# Patient Record
Sex: Male | Born: 1935 | Race: Black or African American | Hispanic: No | Smoking: Former smoker
Health system: Southern US, Community
[De-identification: ages and names within clinical notes are randomized; demographics above are authoritative.]

## PROBLEM LIST (undated history)

## (undated) DIAGNOSIS — L03319 Cellulitis of trunk, unspecified: Secondary | ICD-10-CM

## (undated) DIAGNOSIS — B000 Eczema herpeticum: Secondary | ICD-10-CM

## (undated) DIAGNOSIS — M7500 Adhesive capsulitis of unspecified shoulder: Secondary | ICD-10-CM

## (undated) DIAGNOSIS — K4090 Unilateral inguinal hernia, without obstruction or gangrene, not specified as recurrent: Secondary | ICD-10-CM

## (undated) DIAGNOSIS — L02219 Cutaneous abscess of trunk, unspecified: Secondary | ICD-10-CM

## (undated) DIAGNOSIS — K219 Gastro-esophageal reflux disease without esophagitis: Secondary | ICD-10-CM

## (undated) DIAGNOSIS — M159 Polyosteoarthritis, unspecified: Secondary | ICD-10-CM

## (undated) DIAGNOSIS — I10 Essential (primary) hypertension: Secondary | ICD-10-CM

## (undated) DIAGNOSIS — R42 Dizziness and giddiness: Secondary | ICD-10-CM

## (undated) DIAGNOSIS — E785 Hyperlipidemia, unspecified: Secondary | ICD-10-CM

## (undated) DIAGNOSIS — M545 Low back pain, unspecified: Secondary | ICD-10-CM

## (undated) DIAGNOSIS — E119 Type 2 diabetes mellitus without complications: Secondary | ICD-10-CM

## (undated) HISTORY — PX: EYE SURGERY: SHX253

## (undated) HISTORY — DX: Adhesive capsulitis of unspecified shoulder: M75.00

## (undated) HISTORY — DX: Cellulitis of trunk, unspecified: L03.319

## (undated) HISTORY — DX: Eczema herpeticum: B00.0

## (undated) HISTORY — DX: Cellulitis of trunk, unspecified: L02.219

## (undated) HISTORY — DX: Polyosteoarthritis, unspecified: M15.9

## (undated) HISTORY — DX: Dizziness and giddiness: R42

## (undated) HISTORY — DX: Low back pain: M54.5

## (undated) HISTORY — DX: Gastro-esophageal reflux disease without esophagitis: K21.9

## (undated) HISTORY — DX: Hyperlipidemia, unspecified: E78.5

## (undated) HISTORY — DX: Low back pain, unspecified: M54.50

---

## 2002-01-07 ENCOUNTER — Ambulatory Visit (HOSPITAL_COMMUNITY): Admission: RE | Admit: 2002-01-07 | Discharge: 2002-01-07 | Payer: Self-pay | Admitting: Internal Medicine

## 2013-11-22 ENCOUNTER — Encounter (HOSPITAL_COMMUNITY): Payer: Self-pay | Admitting: Emergency Medicine

## 2013-11-22 ENCOUNTER — Emergency Department (HOSPITAL_COMMUNITY)
Admission: EM | Admit: 2013-11-22 | Discharge: 2013-11-22 | Disposition: A | Discharge status: Home / Self Care | Payer: Medicare HMO | Attending: Emergency Medicine | Admitting: Emergency Medicine

## 2013-11-22 DIAGNOSIS — Z87891 Personal history of nicotine dependence: Secondary | ICD-10-CM | POA: Insufficient documentation

## 2013-11-22 DIAGNOSIS — I10 Essential (primary) hypertension: Secondary | ICD-10-CM | POA: Diagnosis not present

## 2013-11-22 DIAGNOSIS — E119 Type 2 diabetes mellitus without complications: Secondary | ICD-10-CM | POA: Diagnosis not present

## 2013-11-22 DIAGNOSIS — Z8719 Personal history of other diseases of the digestive system: Secondary | ICD-10-CM | POA: Insufficient documentation

## 2013-11-22 DIAGNOSIS — Z79899 Other long term (current) drug therapy: Secondary | ICD-10-CM | POA: Insufficient documentation

## 2013-11-22 DIAGNOSIS — R109 Unspecified abdominal pain: Secondary | ICD-10-CM | POA: Insufficient documentation

## 2013-11-22 DIAGNOSIS — K4090 Unilateral inguinal hernia, without obstruction or gangrene, not specified as recurrent: Secondary | ICD-10-CM | POA: Insufficient documentation

## 2013-11-22 HISTORY — DX: Essential (primary) hypertension: I10

## 2013-11-22 HISTORY — DX: Gastro-esophageal reflux disease without esophagitis: K21.9

## 2013-11-22 HISTORY — DX: Type 2 diabetes mellitus without complications: E11.9

## 2013-11-22 MED ORDER — HYDROCODONE-ACETAMINOPHEN 5-325 MG PO TABS: 2.0000 | ORAL_TABLET | ORAL | Status: AC | PRN

## 2013-11-22 MED ORDER — NAPROXEN 500 MG PO TABS: 500.0000 mg | ORAL_TABLET | Freq: Two times a day (BID) | ORAL | Status: DC

## 2013-11-22 NOTE — ED Notes (Signed)
Pt c/o swelling and pain to left groin that started x 2 days ago

## 2013-11-22 NOTE — Discharge Instructions (Signed)
You MUST call a surgeon to follow up in the next week - if you develop severe groin pain / vomiting / fevers or abdominal pain, return to the ER immediately.  If the hernia comes out or is hurting, lay flat and place gentle pressure on the area to get the hernia to go back in.  If the pain continues, seek immediate medical attention.

## 2013-11-22 NOTE — ED Provider Notes (Signed)
CSN: [id number]     Arrival date & time 11/22/13  1940 History   First MD Initiated Contact with Patient 11/22/13 1957     Chief Complaint  Patient presents with  . Groin Pain     (Consider location/radiation/quality/duration/timing/severity/associated sxs/prior Treatment) HPI Comments: The patient is a 79 year old male with a history of diabetes and hypertension who presents with a complaint of left groin swelling. He states this started after he used a stool softener because of constipation and then while trying to have a bowel movement was bearing down hard. During this he felt acute onset of pain in his left groin with a mass present. He states that the mass is there when he stands up, when he flexes his leg and when he bears down, it goes away when he lays down. He denies pain but states it is only uncomfortable at times, no nausea vomiting abdominal pain fevers or chills.  Patient is a 79 y.o. male presenting with groin pain. The history is provided by the patient.  Groin Pain    Past Medical History  Diagnosis Date  . Diabetes mellitus without complication   . Hypertension   . Acid reflux    Past Surgical History  Procedure Laterality Date  . Eye surgery Left    History reviewed. No pertinent family history. History  Substance Use Topics  . Smoking status: Former Games developer  . Smokeless tobacco: Not on file  . Alcohol Use: Yes     Comment: everyday    Review of Systems  All other systems reviewed and are negative.     Allergies  Aspirin  Home Medications   Prior to Admission medications   Medication Sig Start Date End Date Taking? Authorizing Provider  HYDROcodone-acetaminophen (NORCO/VICODIN) 5-325 MG per tablet Take 2 tablets by mouth every 4 (four) hours as needed. 11/22/13   Vida Roller, MD  naproxen (NAPROSYN) 500 MG tablet Take 1 tablet (500 mg total) by mouth 2 (two) times daily with a meal. 11/22/13   Vida Roller, MD   BP 125/78  Pulse 79   Temp(Src) 99 F (37.2 C) (Oral)  Resp 20  Ht  (1.88 m)  Wt 225 lb (102.059 kg)  BMI 28.88 kg/m2  SpO2 99% Physical Exam  Nursing note and vitals reviewed. Constitutional: He appears well-developed and well-nourished. No distress.  HENT:  Head: Normocephalic and atraumatic.  Mouth/Throat: Oropharynx is clear and moist. No oropharyngeal exudate.  Eyes: Conjunctivae and EOM are normal. Pupils are equal, round, and reactive to light. Right eye exhibits no discharge. Left eye exhibits no discharge. No scleral icterus.  Neck: Normal range of motion. Neck supple. No JVD present. No thyromegaly present.  Cardiovascular: Normal rate, regular rhythm, normal heart sounds and intact distal pulses.  Exam reveals no gallop and no friction rub.   No murmur heard. Pulmonary/Chest: Effort normal and breath sounds normal. No respiratory distress. He has no wheezes. He has no rales.  Abdominal: Soft. Bowel sounds are normal. He exhibits no distension and no mass. There is no tenderness.  Genitourinary:  Normal-appearing penis and scrotum, right inguinal area normal, left inguinal area with hernia present, this is easily reducible with the patient in Trendelenburg position with gentle pressure.  Musculoskeletal: Normal range of motion. He exhibits no edema and no tenderness.  Lymphadenopathy:    He has no cervical adenopathy.  Neurological: He is alert. Coordination normal.  Skin: Skin is warm and dry. No rash noted. No erythema.  Psychiatric:  He has a normal mood and affect. His behavior is normal.    ED Course  Procedures (including critical care time) Labs Review Labs Reviewed - No data to display  Imaging Review No results found.    MDM   Final diagnoses:  Inguinal hernia, left    The patient has an inguinal hernia, no incarceration, no strangulation, easily reducible and in fact the patient's historical description is that itself produces when the patient is in a supine position.  He will need to general surgery followup, he has been informed of the indications for return including for strangulation or incarceration. He expresses his understanding  General surgery followup given   Meds given in ED:  Medications - No data to display  New Prescriptions   HYDROCODONE-ACETAMINOPHEN (NORCO/VICODIN) 5-325 MG PER TABLET    Take 2 tablets by mouth every 4 (four) hours as needed.   NAPROXEN (NAPROSYN) 500 MG TABLET    Take 1 tablet (500 mg total) by mouth 2 (two) times daily with a meal.        Vida Roller, MD 11/22/13 2018

## 2013-11-23 ENCOUNTER — Emergency Department (HOSPITAL_COMMUNITY): Payer: Medicare HMO

## 2013-11-23 ENCOUNTER — Emergency Department (HOSPITAL_COMMUNITY)
Admission: EM | Admit: 2013-11-23 | Discharge: 2013-11-23 | Disposition: A | Discharge status: Home / Self Care | Payer: Medicare HMO | Attending: Emergency Medicine | Admitting: Emergency Medicine

## 2013-11-23 ENCOUNTER — Encounter (HOSPITAL_COMMUNITY): Payer: Self-pay | Admitting: Emergency Medicine

## 2013-11-23 DIAGNOSIS — R319 Hematuria, unspecified: Secondary | ICD-10-CM | POA: Insufficient documentation

## 2013-11-23 DIAGNOSIS — Z79899 Other long term (current) drug therapy: Secondary | ICD-10-CM | POA: Insufficient documentation

## 2013-11-23 DIAGNOSIS — Z7982 Long term (current) use of aspirin: Secondary | ICD-10-CM | POA: Insufficient documentation

## 2013-11-23 DIAGNOSIS — Z87891 Personal history of nicotine dependence: Secondary | ICD-10-CM | POA: Diagnosis not present

## 2013-11-23 DIAGNOSIS — E119 Type 2 diabetes mellitus without complications: Secondary | ICD-10-CM | POA: Insufficient documentation

## 2013-11-23 DIAGNOSIS — Z791 Long term (current) use of non-steroidal anti-inflammatories (NSAID): Secondary | ICD-10-CM | POA: Diagnosis not present

## 2013-11-23 DIAGNOSIS — K219 Gastro-esophageal reflux disease without esophagitis: Secondary | ICD-10-CM | POA: Diagnosis not present

## 2013-11-23 DIAGNOSIS — K4031 Unilateral inguinal hernia, with obstruction, without gangrene, recurrent: Secondary | ICD-10-CM | POA: Diagnosis not present

## 2013-11-23 DIAGNOSIS — N3090 Cystitis, unspecified without hematuria: Secondary | ICD-10-CM

## 2013-11-23 DIAGNOSIS — K4091 Unilateral inguinal hernia, without obstruction or gangrene, recurrent: Secondary | ICD-10-CM

## 2013-11-23 DIAGNOSIS — I10 Essential (primary) hypertension: Secondary | ICD-10-CM | POA: Insufficient documentation

## 2013-11-23 HISTORY — DX: Unilateral inguinal hernia, without obstruction or gangrene, not specified as recurrent: K40.90

## 2013-11-23 LAB — URINALYSIS, ROUTINE W REFLEX MICROSCOPIC
Bilirubin Urine: NEGATIVE
GLUCOSE, UA: NEGATIVE mg/dL
KETONES UR: NEGATIVE mg/dL
Nitrite: NEGATIVE
Protein, ur: 30 mg/dL — AB
Specific Gravity, Urine: 1.025 (ref 1.005–1.030)
UROBILINOGEN UA: 0.2 mg/dL (ref 0.0–1.0)
pH: 5 (ref 5.0–8.0)

## 2013-11-23 LAB — URINE MICROSCOPIC-ADD ON

## 2013-11-23 LAB — I-STAT CHEM 8, ED
BUN: 11 mg/dL (ref 6–23)
Calcium, Ion: 1.26 mmol/L (ref 1.13–1.30)
Chloride: 98 mEq/L (ref 96–112)
Creatinine, Ser: 1 mg/dL (ref 0.50–1.35)
GLUCOSE: 93 mg/dL (ref 70–99)
HCT: 49 % (ref 39.0–52.0)
Hemoglobin: 16.7 g/dL (ref 13.0–17.0)
Potassium: 3.9 mEq/L (ref 3.7–5.3)
SODIUM: 135 meq/L — AB (ref 137–147)
TCO2: 27 mmol/L (ref 0–100)

## 2013-11-23 MED ORDER — CIPROFLOXACIN HCL 500 MG PO TABS: 500.0000 mg | ORAL_TABLET | Freq: Two times a day (BID) | ORAL | Status: DC

## 2013-11-23 NOTE — ED Notes (Signed)
Patient states he did not receive his prescriptions last night. Discharge instructions from last night with patient. RN found RX attached to d/c paperwork and pointed them out to patient.

## 2013-11-23 NOTE — ED Notes (Signed)
Patient seen last night for L groin pain which was a hernia.  Overnight, he began to notice blood in his urine and is returning today for this.

## 2013-11-23 NOTE — Discharge Instructions (Signed)
If you were given medicines take as directed.  If you are on coumadin or contraceptives realize their levels and effectiveness is altered by many different medicines.  If you have any reaction (rash, tongues swelling, other) to the medicines stop taking and see a physician.   Please follow up as directed and return to the ER or see a physician for new or worsening symptoms.  Thank you. Filed Vitals:   11/23/13 1059 11/23/13 1321  BP: 126/77 105/66  Pulse: 80 79  Temp: 98.1 F (36.7 C)   Resp: 18 17  Height:  (1.88 m)   Weight: 225 lb (102.059 kg)   SpO2: 96% 97%    Inguinal Hernia, Adult Muscles help keep everything in the body in its proper place. But if a weak spot in the muscles develops, something can poke through. That is called a hernia. When this happens in the lower part of the belly (abdomen), it is called an inguinal hernia. (It takes its name from a part of the body in this region called the inguinal canal.) A weak spot in the wall of muscles lets some fat or part of the small intestine bulge through. An inguinal hernia can develop at any age. Men get them more often than women. CAUSES  In adults, an inguinal hernia develops over time.  It can be triggered by:  Suddenly straining the muscles of the lower abdomen.  Lifting heavy objects.  Straining to have a bowel movement. Difficult bowel movements (constipation) can lead to this.  Constant coughing. This may be caused by smoking or lung disease.  Being overweight.  Being pregnant.  Working at a job that requires long periods of standing or heavy lifting.  Having had an inguinal hernia before. One type can be an emergency situation. It is called a strangulated inguinal hernia. It develops if part of the small intestine slips through the weak spot and cannot get back into the abdomen. The blood supply can be cut off. If that happens, part of the intestine may die. This situation requires emergency surgery. SYMPTOMS   Often, a small inguinal hernia has no symptoms. It is found when a healthcare provider does a physical exam. Larger hernias usually have symptoms.   In adults, symptoms may include:  A lump in the groin. This is easier to see when the person is standing. It might disappear when lying down.  In men, a lump in the scrotum.  Pain or burning in the groin. This occurs especially when lifting, straining or coughing.  A dull ache or feeling of pressure in the groin.  Signs of a strangulated hernia can include:  A bulge in the groin that becomes very painful and tender to the touch.  A bulge that turns red or purple.  Fever, nausea and vomiting.  Inability to have a bowel movement or to pass gas. DIAGNOSIS  To decide if you have an inguinal hernia, a healthcare provider will probably do a physical examination.  This will include asking questions about any symptoms you have noticed.  The healthcare provider might feel the groin area and ask you to cough. If an inguinal hernia is felt, the healthcare provider may try to slide it back into the abdomen.  Usually no other tests are needed. TREATMENT  Treatments can vary. The size of the hernia makes a difference. Options include:  Watchful waiting. This is often suggested if the hernia is small and you have had no symptoms.  No medical procedure  will be done unless symptoms develop.  You will need to watch closely for symptoms. If any occur, contact your healthcare provider right away.  Surgery. This is used if the hernia is larger or you have symptoms.  Open surgery. This is usually an outpatient procedure (you will not stay overnight in a hospital). An cut (incision) is made through the skin in the groin. The hernia is put back inside the abdomen. The weak area in the muscles is then repaired by herniorrhaphy or hernioplasty. Herniorrhaphy: in this type of surgery, the weak muscles are sewn back together. Hernioplasty: a patch or mesh  is used to close the weak area in the abdominal wall.  Laparoscopy. In this procedure, a surgeon makes small incisions. A thin tube with a tiny video camera (called a laparoscope) is put into the abdomen. The surgeon repairs the hernia with mesh by looking with the video camera and using two long instruments. HOME CARE INSTRUCTIONS   After surgery to repair an inguinal hernia:  You will need to take pain medicine prescribed by your healthcare provider. Follow all directions carefully.  You will need to take care of the wound from the incision.  Your activity will be restricted for awhile. This will probably include no heavy lifting for several weeks. You also should not do anything too active for a few weeks. When you can return to work will depend on the type of job that you have.  During "watchful waiting" periods, you should:  Maintain a healthy weight.  Eat a diet high in fiber (fruits, vegetables and whole grains).  Drink plenty of fluids to avoid constipation. This means drinking enough water and other liquids to keep your urine clear or pale yellow.  Do not lift heavy objects.  Do not stand for long periods of time.  Quit smoking. This should keep you from developing a frequent cough. SEEK MEDICAL CARE IF:   A bulge develops in your groin area.  You feel pain, a burning sensation or pressure in the groin. This might be worse if you are lifting or straining.  You develop a fever of more than 100.5 F (38.1 C). SEEK IMMEDIATE MEDICAL CARE IF:   Pain in the groin increases suddenly.  A bulge in the groin gets bigger suddenly and does not go down.  For men, there is sudden pain in the scrotum. Or, the size of the scrotum increases.  A bulge in the groin area becomes red or purple and is painful to touch.  You have nausea or vomiting that does not go away.  You feel your heart beating much faster than normal.  You cannot have a bowel movement or pass gas.  You  develop a fever of more than 102.0 F (38.9 C). Document Released: 07/09/2008 Document Revised: 05/15/2011 Document Reviewed: 07/09/2008 [facility] Patient Information 2015 [place], [place]. This information is not intended to replace advice given to you by your health care provider. Make sure you discuss any questions you have with your health care provider.

## 2013-11-23 NOTE — ED Provider Notes (Signed)
CSN: [id number]     Arrival date & time 11/23/13  1017 History   This chart was scribed for Enid Skeens, MD by Gwenyth Ober, ED Scribe. This patient was seen in room APA11/APA11 and the patient's care was started at 11:48 AM.     Chief Complaint  Patient presents with  . Hematuria   The history is provided by the patient. No language interpreter was used.   HPI Comments: Victor Church is a 79 y.o. male who presents to the Emergency Department complaining of hematuria that started last night. Pt was in ED last night for hernia. He has no prior history of similar symptoms and hx of kidney stones, kidney disease, and prostate cancer. Pt states he did notice feeling something hard that came through penis during urination 3 days ago. He currently takes laxatives for abnormal BM. Pt denies vomitting, fevers, chills, and pain as associated symptoms.  Past Medical History  Diagnosis Date  . Diabetes mellitus without complication   . Hypertension   . Acid reflux   . Inguinal hernia    Past Surgical History  Procedure Laterality Date  . Eye surgery Left    History reviewed. No pertinent family history. History  Substance Use Topics  . Smoking status: Former Games developer  . Smokeless tobacco: Not on file  . Alcohol Use: Yes     Comment: everyday    Review of Systems  Constitutional: Negative for fever and chills.  Gastrointestinal: Negative for vomiting and abdominal pain.  Genitourinary: Positive for hematuria.  All other systems reviewed and are negative.     Allergies  Aspirin  Home Medications   Prior to Admission medications   Medication Sig Start Date End Date Taking? Authorizing Provider  aspirin EC 81 MG tablet Take 81 mg by mouth daily.    Historical Provider, MD  Glucosamine-Chondroit-Vit C-Mn (GLUCOSAMINE 1500 COMPLEX PO) Take 1 tablet by mouth daily.    Historical Provider, MD  HYDROcodone-acetaminophen (NORCO/VICODIN) 5-325 MG per tablet Take 2 tablets by mouth  every 4 (four) hours as needed. 11/22/13   Vida Roller, MD  lisinopril (PRINIVIL,ZESTRIL) 5 MG tablet Take 5 mg by mouth daily. 09/29/13   Historical Provider, MD  metFORMIN (GLUCOPHAGE) 1000 MG tablet Take 1,000 mg by mouth 2 (two) times daily. 09/29/13   Historical Provider, MD  Multiple Vitamin (MULTIVITAMIN WITH MINERALS) TABS tablet Take 1 tablet by mouth daily.    Historical Provider, MD  naproxen (NAPROSYN) 500 MG tablet Take 1 tablet (500 mg total) by mouth 2 (two) times daily with a meal. 11/22/13   Vida Roller, MD  Omega-3 Fatty Acids (FISH OIL PO) Take 1 capsule by mouth daily.    Historical Provider, MD  pravastatin (PRAVACHOL) 40 MG tablet Take 40 mg by mouth at bedtime. 09/29/13   Historical Provider, MD  ranitidine (ZANTAC) 150 MG tablet Take 150 mg by mouth 2 (two) times daily.    Historical Provider, MD   BP 126/77  Pulse 80  Temp(Src) 98.1 F (36.7 C)  Resp 18  Ht  (1.88 m)  Wt 225 lb (102.059 kg)  BMI 28.88 kg/m2  SpO2 96% Physical Exam  Constitutional: He appears well-developed and well-nourished. No distress.  HENT:  Head: Normocephalic and atraumatic.  Eyes: Conjunctivae and EOM are normal.  Neck: Neck supple. No tracheal deviation present.  Cardiovascular: Normal rate.   Pulmonary/Chest: Effort normal. No respiratory distress.  Abdominal: Soft. There is no tenderness.  Neurological: He is alert.  Skin: Skin is warm and dry.  Psychiatric: He has a normal mood and affect. His behavior is normal.    ED Course  Procedures (including critical care time)  DIAGNOSTIC STUDIES: Oxygen Saturation is 96% on RA, adequate by my interpretation.    COORDINATION OF CARE: 11:51 AM Discussed UA results with pt that revealed large amount of blood, but no significant infection in urine. Will order CT scan of abdomen and lab work. Pt agreed to plan.    Labs Review Labs Reviewed  URINALYSIS, ROUTINE W REFLEX MICROSCOPIC - Abnormal; Notable for the following:     Color, Urine BROWN (*)    APPearance CLOUDY (*)    Hgb urine dipstick LARGE (*)    Protein, ur 30 (*)    Leukocytes, UA SMALL (*)    All other components within normal limits  I-STAT CHEM 8, ED - Abnormal; Notable for the following:    Sodium 135 (*)    All other components within normal limits  URINE MICROSCOPIC-ADD ON    Imaging Review Ct Renal Stone Study  11/23/2013   CLINICAL DATA:  Left groin pain, hematuria.  EXAM: CT ABDOMEN AND PELVIS WITHOUT CONTRAST  TECHNIQUE: Multidetector CT imaging of the abdomen and pelvis was performed following the standard protocol without IV contrast.  COMPARISON:  None.  FINDINGS: Visualization of the lower thorax demonstrates no consolidative or nodular pulmonary opacities. Normal heart size.  Lack of intravenous contrast material limits evaluation the solid organ parenchyma.  Sub cm low-attenuation lesion within the liver (image 15; series 2) is too small accurately characterize. Gallbladder is unremarkable. Spleen, pancreas and bilateral adrenal glands are unremarkable. Kidneys are symmetric in size. There is a 4 mm nonobstructing stone within the interpolar region of the left kidney. Bilateral nonspecific perinephric fat stranding.  Normal caliber abdominal aorta. No retroperitoneal lymphadenopathy. Stool is present throughout the colon. The appendix is normal. Mild wall thickening of the rectum likely secondary to associated adjacent inflammatory change.  The urinary bladder is decompressed however appears thick walled and demonstrates extensive surrounding fat stranding. The superior aspect of the urinary bladder is herniated into a fat containing and bladder containing left inguinal hernia. There is a small amount of fluid within the left inguinal hernia. There is fat stranding about the prostate. Punctate radiodensities adjacent to the left aspect of the urinary bladder favored to represent phleboliths. Small amount of free fluid in the pelvis.  Lower  lumbar spine degenerative change. No aggressive or acute appearing osseous lesions.  IMPRESSION: Urinary bladder is decompressed with wall thickening and extensive surrounding fat stranding. The cranial aspect of the urinary bladder is herniated into a left inguinal hernia. Overall findings are concerning for cystitis with superimposed herniation of the superior portion of the bladder into the left inguinal hernia.  Additionally there is fat stranding about prostate which may be secondary to the process involving urinary bladder however superimposed prostatitis is a consideration.   Electronically Signed   By: Annia Belt M.D.   On: 11/23/2013 12:32     EKG Interpretation None      MDM   Final diagnoses:  Hematuria  Unilateral recurrent inguinal hernia without obstruction or gangrene  Cystitis   I personally performed the services described in this documentation, which was scribed in my presence. The recorded information has been reviewed and is accurate.  Patient with isolated hematuria, no groin or abdominal pain. Discussed differential with the patient. CT scan done no kidney stones seen, mild herniation of  bladder intake while region, no signs of incarceration, patient has no pain in ER and well-appearing. Differential including cystitis/prostatitis however patient has no pain likely cystitis. Followup with urology discussed.  PO abx for home.  Results and differential diagnosis were discussed with the patient/parent/guardian. Close follow up outpatient was discussed, comfortable with the plan.   Medications - No data to display  Filed Vitals:   11/23/13 1059 11/23/13 1321  BP: 126/77 105/66  Pulse: 80 79  Temp: 98.1 F (36.7 C)   Resp: 18 17  Height:  (1.88 m)   Weight: 225 lb (102.059 kg)   SpO2: 96% 97%    Final diagnoses:  Hematuria  Unilateral recurrent inguinal hernia without obstruction or gangrene  Cystitis      Enid Skeens, MD 11/23/13 1336

## 2013-11-23 NOTE — ED Notes (Signed)
Patient with no complaints at this time. Respirations even and unlabored. Skin warm/dry. Discharge instructions reviewed with patient at this time. Patient given opportunity to voice concerns/ask questions. Patient discharged at this time and left Emergency Department with steady gait.   

## 2014-03-17 HISTORY — PX: HERNIA REPAIR: SHX51

## 2014-05-18 ENCOUNTER — Encounter: Payer: Self-pay | Admitting: Internal Medicine

## 2014-05-18 ENCOUNTER — Ambulatory Visit (INDEPENDENT_AMBULATORY_CARE_PROVIDER_SITE_OTHER): Payer: Medicare PPO | Admitting: Internal Medicine

## 2014-05-18 VITALS — BP 140/72 | HR 76 | Ht 75.0 in | Wt 232.1 lb

## 2014-05-18 DIAGNOSIS — R002 Palpitations: Secondary | ICD-10-CM

## 2014-05-18 NOTE — Patient Instructions (Signed)
Your physician recommends that you continue on your current medications as directed. Please refer to the Current Medication list given to you today. Your physician recommends that you schedule a follow-up appointment as needed with Dr. Ross.   

## 2014-05-18 NOTE — Progress Notes (Signed)
Cardiology Office Note   Date:  05/18/2014   ID:  Victor Church, DOB 16-Dec-1934, MRN [id number]  PCP:  PROVIDER NOT IN SYSTEM  Cardiologist:   Dietrich Pates, MD   Patinet referred for evluation of palpitations.     History of Present Illness: Victor Church is a 80 y.o. male who is seen by Ferdie Ping.  Seen on 3/7  Complained of intermitt palpitations associated with dizziness  that occur at least 5x per day for about 1 week.   One episode occurred while driving  He does note this [place] and he gets very anxious on travel on roads unfamiliar to him   Denies syncope Since he was seen on the 7th it has improved some  Much less frequent    Just prior to it starting he had eaten a lot of fried food  Constipated though  This has improved     During day drinks amply  Light urine    Denies CP  No SOB     Current Outpatient Prescriptions  Medication Sig Dispense Refill  . aspirin EC 81 MG tablet Take 81 mg by mouth daily.    . colchicine 0.6 MG tablet Take 0.6 mg by mouth as needed. Two tables than 1 tablet one hour later    . docusate sodium (COLACE) 100 MG capsule Take 100 mg by mouth daily.    . Glucosamine-Chondroit-Vit C-Mn (GLUCOSAMINE 1500 COMPLEX PO) Take 1 tablet by mouth daily.    Marland Kitchen HYDROcodone-acetaminophen (NORCO/VICODIN) 5-325 MG per tablet Take 2 tablets by mouth every 4 (four) hours as needed. 10 tablet 0  . lisinopril (PRINIVIL,ZESTRIL) 5 MG tablet Take 5 mg by mouth daily.    . metFORMIN (GLUCOPHAGE) 1000 MG tablet Take 1,000 mg by mouth 2 (two) times daily.    . Multiple Vitamin (MULTIVITAMIN WITH MINERALS) TABS tablet Take 1 tablet by mouth daily.    . Omega-3 Fatty Acids (FISH OIL PO) Take 1 capsule by mouth daily.    . pravastatin (PRAVACHOL) 40 MG tablet Take 40 mg by mouth at bedtime.    . psyllium (METAMUCIL) 58.6 % powder Take 1 packet by mouth daily.    . ranitidine (ZANTAC) 150 MG tablet Take 150 mg by mouth as needed.      No current  facility-administered medications for this visit.    Allergies:   Aspirin   Past Medical History  Diagnosis Date  . Diabetes mellitus without complication   . Hypertension   . Acid reflux   . Inguinal hernia   . Eczema herpeticum   . Hyperlipidemia   . GERD without esophagitis   . Cellulitis and abscess of trunk   . Generalized osteoarthritis of unspecified site   . Low back pain   . Adhesive capsulitis of shoulder   . Dizziness and giddiness     Past Surgical History  Procedure Laterality Date  . Eye surgery Left   . Hernia repair  03/17/14     Social History:  The patient  reports that he has quit smoking. He does not have any smokeless tobacco history on file. He reports that he drinks alcohol. He reports that he does not use illicit drugs.   Family History:  The patient's family history includes Cancer in his mother; Diabetes in his maternal grandmother.    ROS:  Please see the history of present illness. All other systems are reviewed and  Negative to the above problem except as noted.  PHYSICAL EXAM: VS:  BP 140/72 mmHg  Pulse 76  Ht 6\' 3"  (1.905 m)  Wt 232 lb 1.9 oz (105.289 kg)  BMI 29.01 kg/m2  GEN: Well nourished, well developed, in no acute distress HEENT: normal Neck: no JVD, carotid bruits, or masses Cardiac: RRR; no murmurs, rubs, or gallops,no edema  Respiratory:  clear to auscultation bilaterally, normal work of breathing GI: soft, nontender, nondistended, + BS  No hepatomegaly  MS: no deformity Moving all extremities   Skin: warm and dry, no rash Neuro:  Strength and sensation are intact Psych: euthymic mood, full affect   EKG:  EKG is ordered today.  NSR 76 bpm  Nonspecific ST T wave cahnges     Lipid Panel No results found for: CHOL, TRIG, HDL, CHOLHDL, VLDL, LDLCALC, LDLDIRECT    Wt Readings from Last 3 Encounters:  05/18/14 232 lb 1.9 oz (105.289 kg)  11/23/13 225 lb (102.059 kg)  11/22/13 225 lb (102.059 kg)      ASSESSMENT  AND PLAN: 1  Palpitations  Improving  Short lived when they did occur (seconds) but did have some dizziness  No syncope I would follow  If do not improve I would set up for event monitor.  I would not now since getting better  2.  HTN  Good control  3.  HL  [facility]  Continue statin    Will be available as needed  I would not sched testing now.      Current medicines are reviewed at length with the patient today.  The patient does not have concerns regarding medicines.  The following changes have been made: None    Labs/ tests ordered today include:  NOone No orders of the defined types were placed in this encounter.      Signed, Dietrich PatesPaula Urie Loughner, MD  05/18/2014 10:13 AM    [facility] [address], [place], [place]  [address] Phone: [phone]; Fax: [phone]

## 2015-01-04 ENCOUNTER — Emergency Department (HOSPITAL_COMMUNITY)
Admission: EM | Admit: 2015-01-04 | Discharge: 2015-01-04 | Discharge status: Against Medical Advice | Payer: Medicare PPO | Attending: Emergency Medicine | Admitting: Emergency Medicine

## 2015-01-04 ENCOUNTER — Encounter (HOSPITAL_COMMUNITY): Payer: Self-pay

## 2015-01-04 DIAGNOSIS — I10 Essential (primary) hypertension: Secondary | ICD-10-CM | POA: Insufficient documentation

## 2015-01-04 DIAGNOSIS — L0292 Furuncle, unspecified: Secondary | ICD-10-CM | POA: Insufficient documentation

## 2015-01-04 DIAGNOSIS — E119 Type 2 diabetes mellitus without complications: Secondary | ICD-10-CM | POA: Insufficient documentation

## 2015-01-04 NOTE — ED Notes (Signed)
Pt states he has a boil that he wants lanced. States he is already on an antibiotic for same

## 2015-02-24 IMAGING — CT CT RENAL STONE PROTOCOL
2 of 4 series · 16 of 46 positions shown, 18 images · IV contrast (Omnipaque 300)
Comparison: None.

CLINICAL DATA: Left groin pain, hematuria.

EXAM:
CT ABDOMEN AND PELVIS WITHOUT CONTRAST
TECHNIQUE: Multidetector CT imaging of the abdomen and pelvis was performed
following the standard protocol without IV contrast.

[Series 2: abd_pel w/o 5.0 b40f · axial · non-contrast · 0.84mm/px · z∈[-538,-88]mm · 13 of 100 slices shown, 15 images]
[im 5/100  soft-tissue]
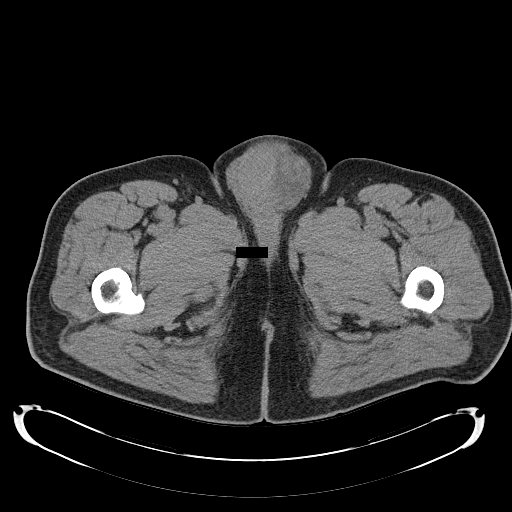
[im 5/100  bone]
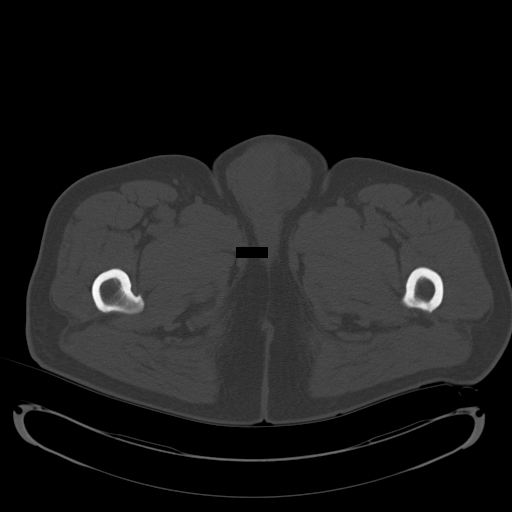
[im 13/100  soft-tissue]
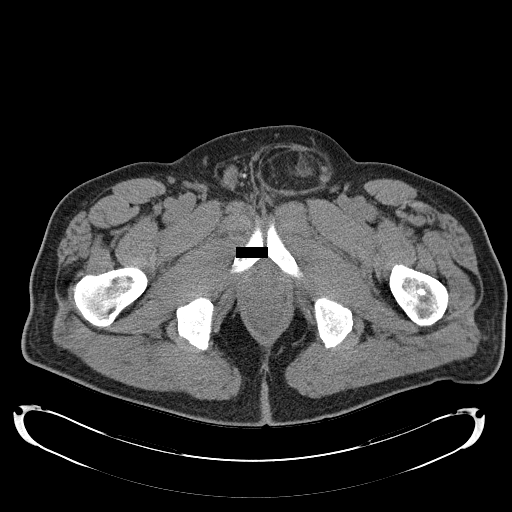
[im 21/100  soft-tissue]
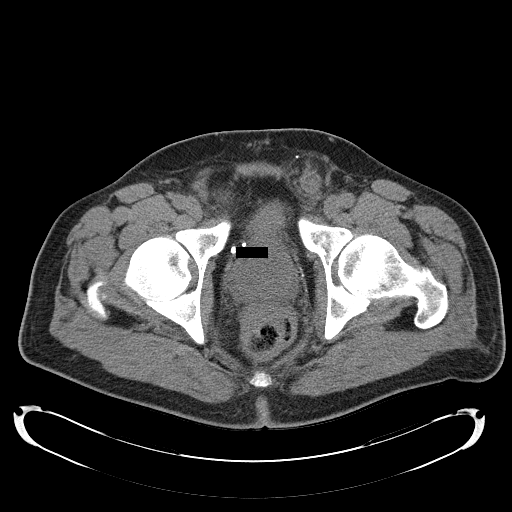
[im 29/100  soft-tissue]
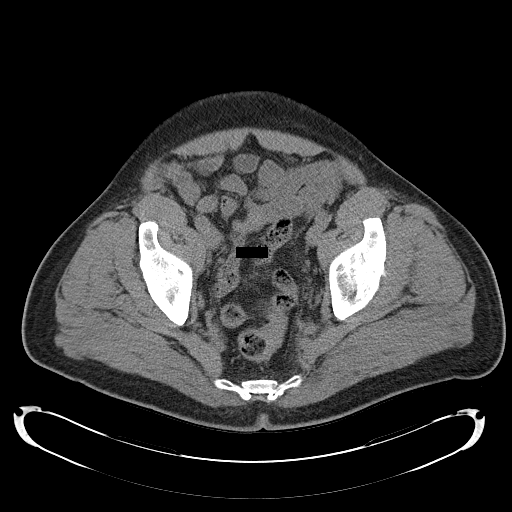
[im 34/100  soft-tissue]
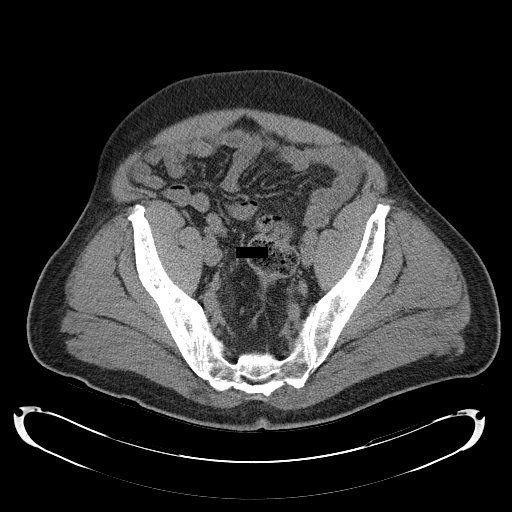
[im 42/100  soft-tissue]
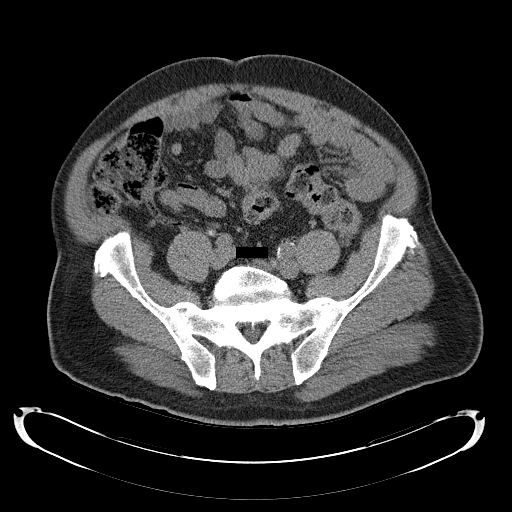
[im 50/100  soft-tissue]
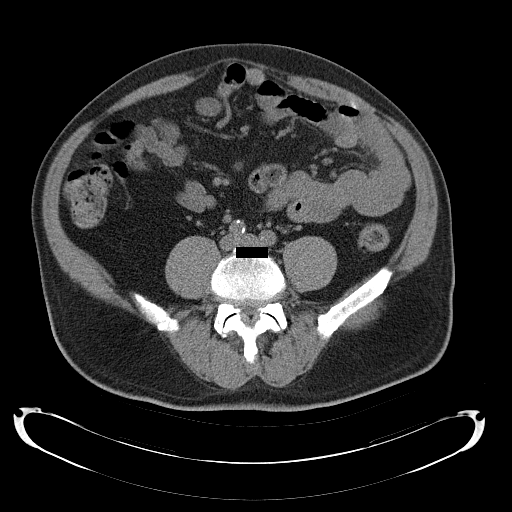
[im 58/100  soft-tissue]
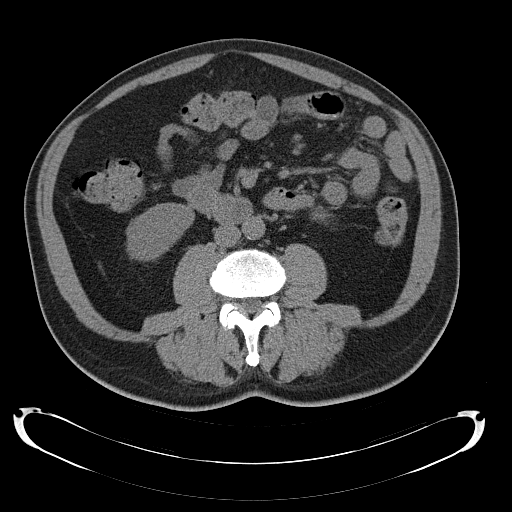
[im 67/100  soft-tissue]
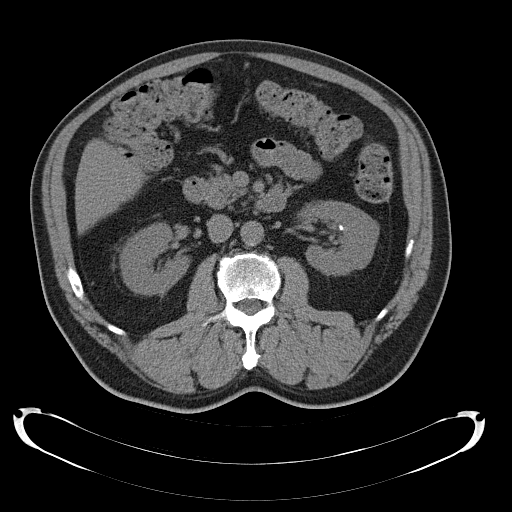
[im 67/100  bone]
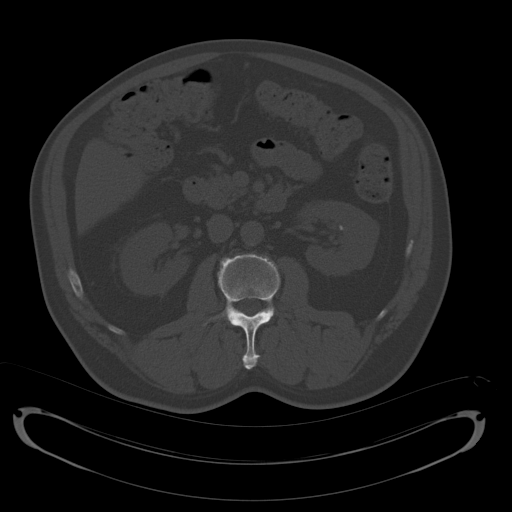
[im 71/100  soft-tissue]
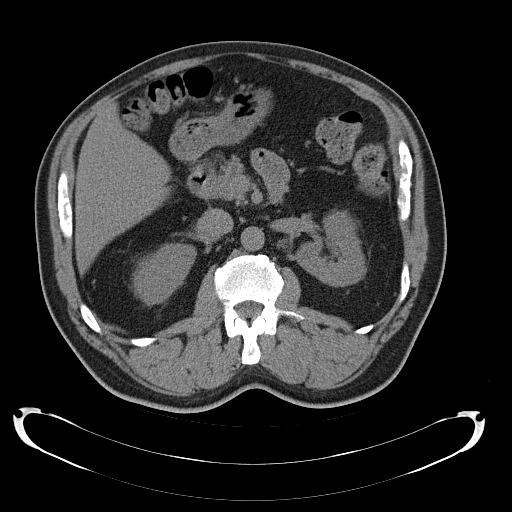
[im 79/100  soft-tissue]
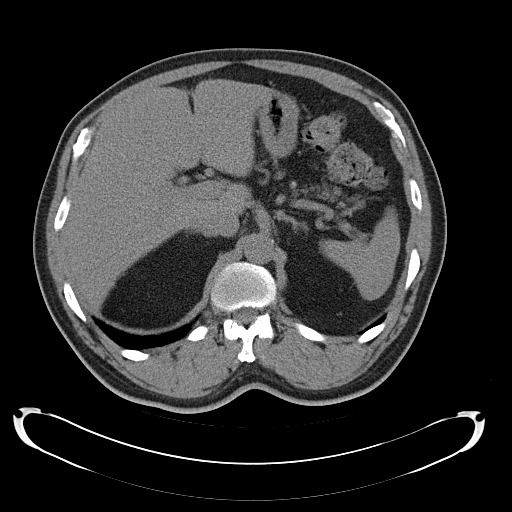
[im 87/100  soft-tissue]
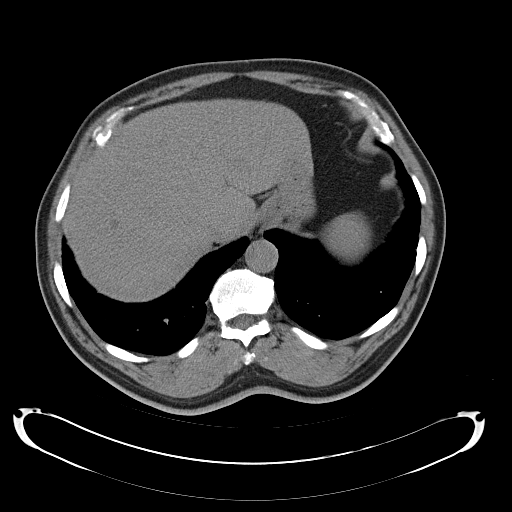
[im 95/100  soft-tissue]
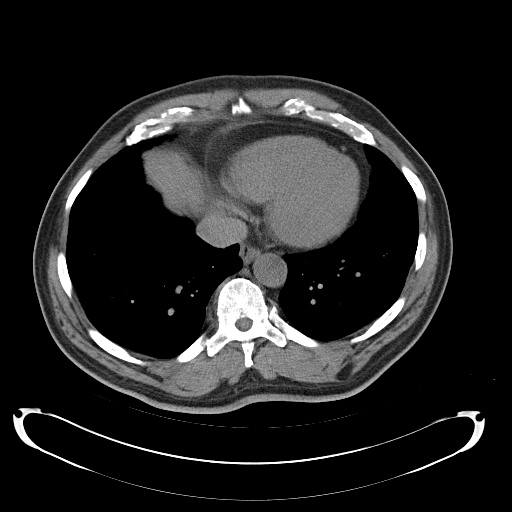

[Series 3: abd_pel w/o 3.0 spo cor cor · coronal · non-contrast · 0.82mm/px · 3 of 98 slices shown]
[im 33/98  soft-tissue]
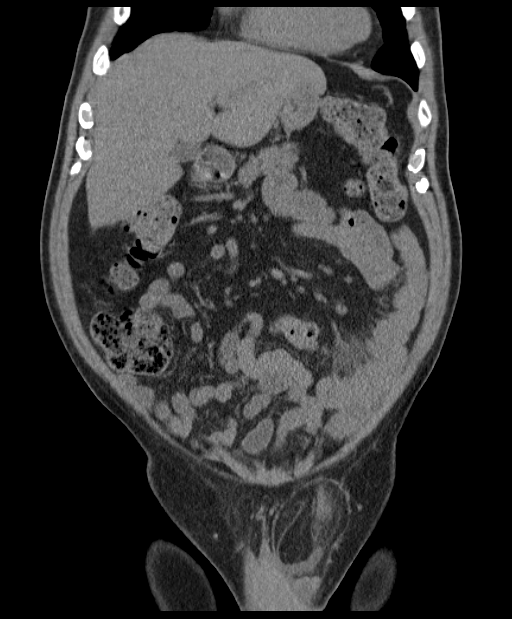
[im 44/98  soft-tissue]
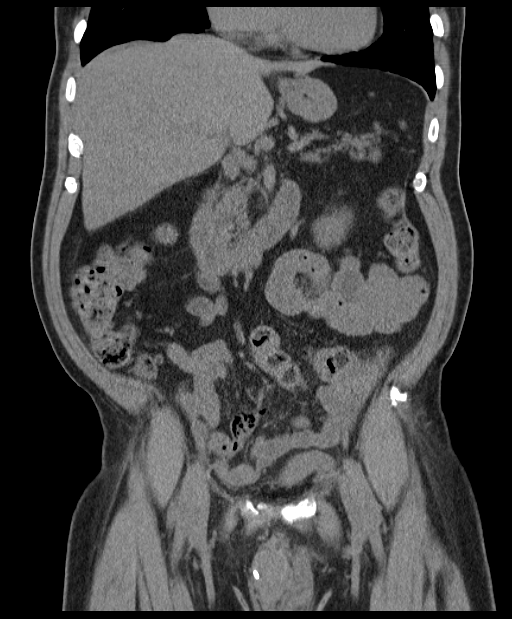
[im 54/98  soft-tissue]
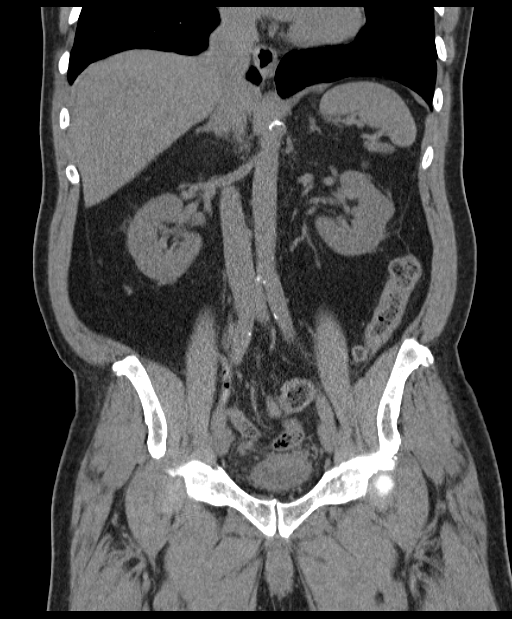

[16 of 46 positions shown; findings below may reference images not displayed]

FINDINGS: Visualization of the lower thorax demonstrates no consolidative or
nodular pulmonary opacities. Normal heart size.

Lack of intravenous contrast material limits evaluation the solid
organ parenchyma.

Sub cm low-attenuation lesion within the liver (image 15; series 2)
is too small accurately characterize. Gallbladder is unremarkable.
Spleen, pancreas and bilateral adrenal glands are unremarkable.
Kidneys are symmetric in size. There is a 4 mm nonobstructing stone
within the interpolar region of the left kidney. Bilateral
nonspecific perinephric fat stranding.

Normal caliber abdominal aorta. No retroperitoneal lymphadenopathy.
Stool is present throughout the colon. The appendix is normal. Mild
wall thickening of the rectum likely secondary to associated
adjacent inflammatory change.

The urinary bladder is decompressed however appears thick walled and
demonstrates extensive surrounding fat stranding. The superior
aspect of the urinary bladder is herniated into a fat containing and
bladder containing left inguinal hernia. There is a small amount of
fluid within the left inguinal hernia. There is fat stranding about
the prostate. Punctate radiodensities adjacent to the left aspect of
the urinary bladder favored to represent phleboliths. Small amount
of free fluid in the pelvis.

Lower lumbar spine degenerative change. No aggressive or acute
appearing osseous lesions.
IMPRESSION: Urinary bladder is decompressed with wall thickening and extensive
surrounding fat stranding. The cranial aspect of the urinary bladder
is herniated into a left inguinal hernia. Overall findings are
concerning for cystitis with superimposed herniation of the superior
portion of the bladder into the left inguinal hernia.

Additionally there is fat stranding about prostate which may be
secondary to the process involving urinary bladder however
superimposed prostatitis is a consideration.

## 2022-01-24 ENCOUNTER — Emergency Department (HOSPITAL_COMMUNITY)
Admission: EM | Admit: 2022-01-24 | Discharge: 2022-01-24 | Disposition: A | Discharge status: Home / Self Care | Payer: Medicare PPO | Attending: Emergency Medicine | Admitting: Emergency Medicine

## 2022-01-24 ENCOUNTER — Emergency Department (HOSPITAL_COMMUNITY): Payer: Medicare PPO

## 2022-01-24 ENCOUNTER — Other Ambulatory Visit: Payer: Self-pay

## 2022-01-24 ENCOUNTER — Encounter (HOSPITAL_COMMUNITY): Payer: Self-pay

## 2022-01-24 DIAGNOSIS — Z7982 Long term (current) use of aspirin: Secondary | ICD-10-CM | POA: Insufficient documentation

## 2022-01-24 DIAGNOSIS — Z7984 Long term (current) use of oral hypoglycemic drugs: Secondary | ICD-10-CM | POA: Diagnosis not present

## 2022-01-24 DIAGNOSIS — I10 Essential (primary) hypertension: Secondary | ICD-10-CM | POA: Insufficient documentation

## 2022-01-24 DIAGNOSIS — K6289 Other specified diseases of anus and rectum: Secondary | ICD-10-CM | POA: Diagnosis present

## 2022-01-24 DIAGNOSIS — K5900 Constipation, unspecified: Secondary | ICD-10-CM | POA: Diagnosis not present

## 2022-01-24 DIAGNOSIS — E119 Type 2 diabetes mellitus without complications: Secondary | ICD-10-CM | POA: Diagnosis not present

## 2022-01-24 DIAGNOSIS — Z79899 Other long term (current) drug therapy: Secondary | ICD-10-CM | POA: Insufficient documentation

## 2022-01-24 LAB — COMPREHENSIVE METABOLIC PANEL
ALT: 8 U/L (ref 0–44)
AST: 16 U/L (ref 15–41)
Albumin: 3.7 g/dL (ref 3.5–5.0)
Alkaline Phosphatase: 42 U/L (ref 38–126)
Anion gap: 6 (ref 5–15)
BUN: 12 mg/dL (ref 8–23)
CO2: 26 mmol/L (ref 22–32)
Calcium: 9.1 mg/dL (ref 8.9–10.3)
Chloride: 103 mmol/L (ref 98–111)
Creatinine, Ser: 1.13 mg/dL (ref 0.61–1.24)
GFR, Estimated: >60 mL/min (ref >60)
Glucose, Bld: 141 mg/dL — ABNORMAL HIGH (ref 70–99)
Potassium: 3.6 mmol/L (ref 3.5–5.1)
Sodium: 135 mmol/L (ref 135–145)
Total Bilirubin: 1.3 mg/dL — ABNORMAL HIGH (ref 0.3–1.2)
Total Protein: 7 g/dL (ref 6.5–8.1)

## 2022-01-24 LAB — CBC WITH DIFFERENTIAL/PLATELET
Abs Immature Granulocytes: 0.04 10*3/uL (ref 0.00–0.07)
Basophils Absolute: 0 10*3/uL (ref 0.0–0.1)
Basophils Relative: 0 %
Eosinophils Absolute: 0 10*3/uL (ref 0.0–0.5)
Eosinophils Relative: 0 %
HCT: 37.6 % — ABNORMAL LOW (ref 39.0–52.0)
Hemoglobin: 12.7 g/dL — ABNORMAL LOW (ref 13.0–17.0)
Immature Granulocytes: 0 %
Lymphocytes Relative: 6 %
Lymphs Abs: 0.7 10*3/uL (ref 0.7–4.0)
MCH: 31.4 pg (ref 26.0–34.0)
MCHC: 33.8 g/dL (ref 30.0–36.0)
MCV: 92.8 fL (ref 80.0–100.0)
Monocytes Absolute: 1.1 10*3/uL — ABNORMAL HIGH (ref 0.1–1.0)
Monocytes Relative: 10 %
Neutro Abs: 9.2 10*3/uL — ABNORMAL HIGH (ref 1.7–7.7)
Neutrophils Relative %: 84 %
Platelets: 202 10*3/uL (ref 150–400)
RBC: 4.05 MIL/uL — ABNORMAL LOW (ref 4.22–5.81)
RDW: 14.1 % (ref 11.5–15.5)
WBC: 11.1 10*3/uL — ABNORMAL HIGH (ref 4.0–10.5)
nRBC: 0 % (ref 0.0–0.2)

## 2022-01-24 MED ORDER — MAGNESIUM HYDROXIDE 400 MG/5ML PO SUSP
960.0000 mL | Freq: Once | ORAL | Status: AC
  Administered 2022-01-24: 960 mL via RECTAL
  Filled 2022-01-24: qty 240

## 2022-01-24 MED ORDER — LIDOCAINE (ANORECTAL) 5 % EX CREA: TOPICAL_CREAM | CUTANEOUS | 0 refills | Status: AC

## 2022-01-24 NOTE — ED Triage Notes (Signed)
Pt c/o painful hemorroids since Sunday, no bleeding noted . Pt reported some loose stools also.

## 2022-01-24 NOTE — Discharge Instructions (Signed)
Your work-up today showed significant constipation.  You have been given an enema to help with this.  As discussed, it is very important that you drink plenty of water every day.  Try to eat lots of leafy green vegetables and foods high in fiber.  You may continue to use the Anusol cream you were prescribed yesterday in addition to the lidocaine prescribed today.  Try using sitz bath's several times a day to help with the discomfort as well.  Please follow-up with your primary care provider for recheck.  Return emergency department for any new or worsening symptoms.

## 2022-01-24 NOTE — ED Notes (Signed)
Patient on BSC. Had large stool BM x 4 as result of enema.  Patient states abdomen is relieved and he feels much better.

## 2022-01-28 NOTE — ED Provider Notes (Signed)
[facility] EMERGENCY DEPARTMENT Provider Note   CSN: [id number] Arrival date & time: 01/24/22  1019     History  Chief Complaint  Patient presents with   Hemorrhoids    Victor Church is a 87 y.o. male.  HPI     Victor Church is a 87 y.o. male with hx of DM, HTN, recurrent constipation who presents to the Emergency Department complaining of rectal pain and hemorrhoids.  Constipated for several days.  Admits to straining to defecate.  Used enema and laxative w/o relief.  HAd several bouts of watery stools since then.  Denies any abdominal pain, vomiting or fever.  Seen by PCP and prescribed Anusol cream which he has used several times w/o relief.    Home Medications Prior to Admission medications   Medication Sig Start Date End Date Taking? Authorizing Provider  Lidocaine, Anorectal, 5 % CREA Apply small amt to the rectum 4 times per day as needed.  Do not insert inside the rectum 01/24/22  Yes Franny Selvage, PA-C  aspirin EC 81 MG tablet Take 81 mg by mouth daily.    [provider]  colchicine 0.6 MG tablet Take 0.6 mg by mouth as needed. Two tables than 1 tablet one hour later    [provider]  docusate sodium (COLACE) 100 MG capsule Take 100 mg by mouth daily.    [provider]  Glucosamine-Chondroit-Vit C-Mn (GLUCOSAMINE 1500 COMPLEX PO) Take 1 tablet by mouth daily.    [provider]  HYDROcodone-acetaminophen (NORCO/VICODIN) 5-325 MG per tablet Take 2 tablets by mouth every 4 (four) hours as needed. 11/22/13   Eber Hong, MD  lisinopril (PRINIVIL,ZESTRIL) 5 MG tablet Take 5 mg by mouth daily. 09/29/13   [provider]  metFORMIN (GLUCOPHAGE) 1000 MG tablet Take 1,000 mg by mouth 2 (two) times daily. 09/29/13   [provider]  Multiple Vitamin (MULTIVITAMIN WITH MINERALS) TABS tablet Take 1 tablet by mouth daily.    [provider]  Omega-3 Fatty Acids (FISH OIL PO) Take 1 capsule by mouth daily.     [provider]  pravastatin (PRAVACHOL) 40 MG tablet Take 40 mg by mouth at bedtime. 09/29/13   [provider]  psyllium (METAMUCIL) 58.6 % powder Take 1 packet by mouth daily.    [provider]  ranitidine (ZANTAC) 150 MG tablet Take 150 mg by mouth as needed.     [provider]      Allergies    Aspirin    Review of Systems   Review of Systems  Constitutional:  Negative for activity change, appetite change and fever.  Respiratory:  Negative for shortness of breath.   Cardiovascular:  Negative for chest pain.  Gastrointestinal:  Positive for constipation and rectal pain. Negative for abdominal distention, abdominal pain, nausea and vomiting.  Genitourinary:  Negative for difficulty urinating and flank pain.  Musculoskeletal:  Negative for arthralgias.  Neurological:  Negative for dizziness and light-headedness.    Physical Exam Updated Vital Signs BP 130/78   Pulse 80   Temp 98.2 F (36.8 C) (Oral)   Resp 18   Ht 6\' 2"  (1.88 m)   Wt 96.6 kg   SpO2 98%   BMI 27.35 kg/m  Physical Exam Vitals and nursing note reviewed. Exam conducted with a chaperone present.  Constitutional:      General: He is not in acute distress.    Appearance: Normal appearance. He is not ill-appearing or toxic-appearing.  HENT:  Mouth/Throat:     Mouth: Mucous membranes are moist.  Cardiovascular:     Rate and Rhythm: Normal rate and regular rhythm.     Pulses: Normal pulses.  Pulmonary:     Effort: Pulmonary effort is normal.     Breath sounds: Normal breath sounds.  Abdominal:     General: There is no distension.     Palpations: Abdomen is soft.     Tenderness: There is no abdominal tenderness.  Genitourinary:    Rectum: Guaiac result positive. Tenderness present. No anal fissure or external hemorrhoid. Normal anal tone.     Comments: Brown, heme positive stool.  No palpable or visualized internal or external hemorrhoid.  Large palpable stool in  rectum.   Musculoskeletal:        General: Normal range of motion.  Skin:    Capillary Refill: Capillary refill takes less than 2 seconds.  Neurological:     General: No focal deficit present.     Mental Status: He is alert.     Sensory: No sensory deficit.     Motor: No weakness.     ED Results / Procedures / Treatments   Labs (all labs ordered are listed, but only abnormal results are displayed) Labs Reviewed  CBC WITH DIFFERENTIAL/PLATELET - Abnormal; Notable for the following components:      Result Value   WBC 11.1 (*)    RBC 4.05 (*)    Hemoglobin 12.7 (*)    HCT 37.6 (*)    Neutro Abs 9.2 (*)    Monocytes Absolute 1.1 (*)    All other components within normal limits  COMPREHENSIVE METABOLIC PANEL - Abnormal; Notable for the following components:   Glucose, Bld 141 (*)    Total Bilirubin 1.3 (*)    All other components within normal limits    EKG None  Radiology No results found.  Procedures Procedures    Medications Ordered in ED Medications  sorbitol, milk of mag, mineral oil, glycerin (SMOG) enema (960 mLs Rectal Given 01/24/22 1540)    ED Course/ Medical Decision Making/ A&P                           Medical Decision Making Pt here with rectal pain and question of hemorrhoids.  Seen by PCP for same.  Denies abd pain, fever and vomiting.  No recent abdominal surgeries.  Large palpable stool burden on exam.  heme positive stool, no hemorrhoid on exam.      Amount and/or Complexity of Data Reviewed Labs: ordered.    Details: Labs reassuring.  HGB 12.7 Radiology: ordered.    Details: Abd imaging shows moderate stool w/o evidence of SBO Discussion of management or test interpretation with external provider(s): Likely rectal impaction and possible anal fissure although not seen on exam, due to amount of stool  Pt given SMOG enema here, had large BM and reports feeling better.  Vitals reassuring, no evidence of significant anemia, doubt acute abd  process.  Will provide rx for rectal lidocaine and recommend sitz baths.  He appears approp d/c home  return precautions discussed.   Risk OTC drugs.           Final Clinical Impression(s) / ED Diagnoses Final diagnoses:  Constipation, unspecified constipation type  Rectal pain    Rx / DC Orders ED Discharge Orders          Ordered    Lidocaine, Anorectal, 5 % CREA  01/24/22 1601              Pauline Aus, PA-C 01/28/22 1113    Gloris Manchester, MD 01/29/22 [phone]
